# Patient Record
Sex: Female | Born: 2000 | Race: White | Hispanic: No | Marital: Single | Smoking: Never smoker
Health system: Southern US, Community
[De-identification: ages and names within clinical notes are randomized; demographics above are authoritative.]

## PROBLEM LIST (undated history)

## (undated) DIAGNOSIS — E282 Polycystic ovarian syndrome: Secondary | ICD-10-CM

## (undated) HISTORY — DX: Polycystic ovarian syndrome: E28.2

---
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
## 2022-02-20 ENCOUNTER — Emergency Department (HOSPITAL_COMMUNITY): Payer: PRIVATE HEALTH INSURANCE

## 2022-02-20 ENCOUNTER — Encounter (HOSPITAL_COMMUNITY): Payer: Self-pay | Admitting: Emergency Medicine

## 2022-02-20 ENCOUNTER — Emergency Department (HOSPITAL_COMMUNITY)
Admission: EM | Admit: 2022-02-20 | Discharge: 2022-02-20 | Disposition: A | Discharge status: Home / Self Care | Payer: PRIVATE HEALTH INSURANCE | Attending: Emergency Medicine | Admitting: Emergency Medicine

## 2022-02-20 DIAGNOSIS — M25561 Pain in right knee: Secondary | ICD-10-CM | POA: Insufficient documentation

## 2022-02-20 NOTE — ED Provider Notes (Incomplete)
Emergency Department Provider Note   I have reviewed the triage vital signs and the nursing notes.   HISTORY  Chief Complaint Knee Pain   HPI Jadah Bobak is a 21 y.o. female ***   {**SYMPTOM/COMPLAINT  LOCATION (describe anatomically) DURATION (when did it start) TIMING (onset and pattern) SEVERITY (0-10, mild/moderate/severe) QUALITY (description of symptoms) CONTEXT (recent surgery, new meds, activity, etc.) MODIFYINGFACTORS (what makes it better/worse) ASSOCIATEDSYMPTOMS (pertinent positives and negatives)**}  Past Medical History:  Diagnosis Date   PCOS (polycystic ovarian syndrome)     Review of Systems {** Revise as appropriate then delete this line - Documentation of 10 systems OR 2 systems and "10-point ROS otherwise negative" is required **}Constitutional: No fever/chills Eyes: No visual changes. ENT: No sore throat. Cardiovascular: Denies chest pain. Respiratory: Denies shortness of breath. Gastrointestinal: No abdominal pain.  No nausea, no vomiting.  No diarrhea.  No constipation. Genitourinary: Negative for dysuria. Musculoskeletal: Negative for back pain. Skin: Negative for rash. Neurological: Negative for headaches, focal weakness or numbness. {**Psychiatric:  Endocrine:  Hematological/Lymphatic:  Allergic/Immunilogical: **}  ____________________________________________   PHYSICAL EXAM:  VITAL SIGNS: ED Triage Vitals  Enc Vitals Group     BP 02/20/22 1701 127/85     Pulse Rate 02/20/22 1701 90     Resp 02/20/22 1701 16     Temp 02/20/22 1701 98.2 F (36.8 C)     Temp Source 02/20/22 1701 Oral     SpO2 02/20/22 1701 99 %     Weight --      Height --      Head Circumference --      Peak Flow --      Pain Score 02/20/22 1659 0     Pain Loc --      Pain Edu? --      Excl. in GC? --    {** Revise as appropriate then delete this line - 8 systems required **} Constitutional: Alert and oriented. Well appearing and in no acute  distress. Eyes: Conjunctivae are normal. PERRL. EOMI. Head: Atraumatic. {**Ears:  Healthy appearing ear canals and TMs bilaterally **}Nose: No congestion/rhinnorhea. Mouth/Throat: Mucous membranes are moist.  Oropharynx non-erythematous. Neck: No stridor.  No meningeal signs.  {**No cervical spine tenderness to palpation.**} Cardiovascular: Normal rate, regular rhythm. Good peripheral circulation. Grossly normal heart sounds.   Respiratory: Normal respiratory effort.  No retractions. Lungs CTAB. Gastrointestinal: Soft and nontender. No distention.  {**Genitourinary:  **}Musculoskeletal: No lower extremity tenderness nor edema. No gross deformities of extremities. Neurologic:  Normal speech and language. No gross focal neurologic deficits are appreciated.  Skin:  Skin is warm, dry and intact. No rash noted. {**Psychiatric: Mood and affect are normal. Speech and behavior are normal.**}  ____________________________________________   LABS (all labs ordered are listed, but only abnormal results are displayed)  Labs Reviewed - No data to display ____________________________________________  EKG  *** ____________________________________________  RADIOLOGY  DG Knee Complete 4 Views Right  Result Date: 02/20/2022 CLINICAL DATA:  Knee pain. EXAM: RIGHT KNEE - COMPLETE 4+ VIEW COMPARISON:  None Available. FINDINGS: No evidence of fracture, dislocation, or joint effusion. No evidence of arthropathy or other focal bone abnormality. Soft tissues are unremarkable. IMPRESSION: Negative. Electronically Signed   By: Darliss Cheney M.D.   On: 02/20/2022 17:30    ____________________________________________   PROCEDURES  Procedure(s) performed:   Procedures   ____________________________________________   INITIAL IMPRESSION / ASSESSMENT AND PLAN / ED COURSE  Pertinent labs & imaging results that were available  during my care of the patient were reviewed by me and considered in my  medical decision making (see chart for details).   This patient is Presenting for Evaluation of ***, which {Range:23949} require a range of treatment options, and {MDMcomplaint:23950} a complaint that involves a {MDMlevelrisk:23951} risk of morbidity and mortality.  The Differential Diagnoses include***.  Critical Interventions-    Medications - No data to display  Reassessment after intervention:     I *** Additional Historical Information from ***, as the patient is ***.  I decided to review pertinent External Data, and in summary ***.   Clinical Laboratory Tests Ordered, included   Radiologic Tests Ordered, included ***. I independently interpreted the images and agree with radiology interpretation.   Cardiac Monitor Tracing which shows ***   Social Determinants of Health Risk ***  Consult complete with  Medical Decision Making: Summary: ***  Reevaluation with update and discussion with   ***Considered admission***  Disposition:   ____________________________________________  FINAL CLINICAL IMPRESSION(S) / ED DIAGNOSES  Final diagnoses:  None     NEW OUTPATIENT MEDICATIONS STARTED DURING THIS VISIT:  New Prescriptions   No medications on file    Note:  This document was prepared using Dragon voice recognition software and may include unintentional dictation errors.  Alona Bene, MD, [facility]

## 2022-02-20 NOTE — Discharge Instructions (Signed)

## 2022-02-20 NOTE — ED Triage Notes (Signed)
Patient complains of right knee pain that started at approximately 2000 last night. Patient was at work, knelt down and when she stood back up she felt a "pop" in her right knee and the pain started. Patient states she has been unable to bear weight since the event.

## 2022-02-20 NOTE — ED Notes (Signed)
Educated pt on crutch use and ace bandage. Pt demonstrated ability to use crutch w/ no issue.

## 2022-02-20 NOTE — ED Provider Triage Note (Signed)
Emergency Medicine Provider Triage Evaluation Note  Alicia Hood , a 21 y.o. female  was evaluated in triage.  Pt complains of acute onset of right knee pain and lower leg pain starting last night.  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  She has been unable to bear weight since that time.  She has pain in the back of the knee and the right lateral calf.  She has wrapped the injury prior to arrival.  Review of Systems  Positive: Leg pain, knee pain Negative: Numbness, tingling  Physical Exam  There were no vitals taken for this visit. Gen:   Awake, no distress   Resp:  Normal effort  MSK:   Moves extremities without difficulty  Other:  Tenderness to palpation in the popliteal fossa in the right posterior lateral calf, no tenderness over the patella, no obvious patellar tendon or quadriceps tendon injury  Medical Decision Making  Medically screening exam initiated at 5:00 PM.  Appropriate orders placed.  Lirio Bach was informed that the remainder of the evaluation will be completed by another provider, this initial triage assessment does not replace that evaluation, and the importance of remaining in the ED until their evaluation is complete.     Renne Crigler, PA-C 02/20/22 1701

## 2022-04-08 ENCOUNTER — Other Ambulatory Visit: Payer: Self-pay | Admitting: Family

## 2022-06-29 ENCOUNTER — Encounter: Payer: Self-pay | Admitting: Family

## 2022-07-08 ENCOUNTER — Encounter: Payer: Self-pay | Admitting: Family

## 2022-07-08 ENCOUNTER — Ambulatory Visit (INDEPENDENT_AMBULATORY_CARE_PROVIDER_SITE_OTHER): Payer: 59 | Admitting: Family

## 2022-07-08 VITALS — BP 132/89 | HR 93 | Ht 64.27 in | Wt 255.0 lb

## 2022-07-08 DIAGNOSIS — G4726 Circadian rhythm sleep disorder, shift work type: Secondary | ICD-10-CM | POA: Diagnosis not present

## 2022-07-08 DIAGNOSIS — Z3202 Encounter for pregnancy test, result negative: Secondary | ICD-10-CM

## 2022-07-08 DIAGNOSIS — E282 Polycystic ovarian syndrome: Secondary | ICD-10-CM | POA: Diagnosis not present

## 2022-07-08 DIAGNOSIS — N898 Other specified noninflammatory disorders of vagina: Secondary | ICD-10-CM

## 2022-07-08 DIAGNOSIS — F4323 Adjustment disorder with mixed anxiety and depressed mood: Secondary | ICD-10-CM

## 2022-07-08 DIAGNOSIS — Z113 Encounter for screening for infections with a predominantly sexual mode of transmission: Secondary | ICD-10-CM

## 2022-07-08 LAB — POCT URINE PREGNANCY: Preg Test, Ur: NEGATIVE

## 2022-07-08 MED ORDER — NAPROXEN 250 MG PO TABS: 500.0000 mg | ORAL_TABLET | Freq: Two times a day (BID) | ORAL | 0 refills | Status: DC

## 2022-07-08 MED ORDER — NORETHINDRONE ACET-ETHINYL EST 1.5-30 MG-MCG PO TABS: 1.0000 | ORAL_TABLET | Freq: Every day | ORAL | 3 refills | Status: DC

## 2022-07-08 NOTE — Progress Notes (Unsigned)
THIS RECORD MAY CONTAIN CONFIDENTIAL INFORMATION THAT SHOULD NOT BE RELEASED WITHOUT REVIEW OF THE SERVICE PROVIDER.  Adolescent Medicine Consultation Follow-Up Visit Alicia Hood  is a 21 y.o. 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 provider found here today for follow-up regarding birth control refill.  Patient states she feels better without birth control mentally but has worse cramps. Was started on OCP d/t PCOS with bad cramps but is unsure about the flow. Patient denied any recent sexually activity, fever, abnormal discharge, rash or illness. She denied any abdominal pain or pain with intercourse.  Last time took OCP: august 1st day of LMP: 06/29/22 - 07/01/10/1 How long: 3-4 days Flow: not very heavy but up and down (avg ~ 3 regular tampons per day) SXS: really bad abdominal cramps at the start of the period (debilitating cramps prevents her from working) - was taking about 600 mg Q5H Mood: much better off the OCPs - doesn't feel as lonely or need to be distracted Sexually active: yes with men (uses condoms 100% of the time)     Supervising physician: Dr. Theadore Nan   Plan at last adolescent specialty clinic visit included 08/15/21 -continue sertraline 50 mg QD  -rule of 3's (3 meals, 3 snacks and no more than 3 hours awake btw meals)  Pertinent Labs? Yes - negative gc/c 08/29/21 Growth Chart Viewed? not applicable   History was provided by the patient.   HPI:   PCP Confirmed?  Does not have one yet (needs to set up with family [facility]) is working on her insurance   Patient's personal or confidential phone number: [phone]   No LMP recorded. No Known Allergies Current Outpatient Medications on File Prior to Visit  Medication Sig Dispense Refill   ibuprofen (ADVIL) 600 MG tablet Take 1 tablet (600 mg total) by mouth every 6 (six) hours as needed. 30 tablet 0   BLISOVI FE 1/20  1-20 MG-MCG tablet TAKE 1 TABLET BY MOUTH DAILY (Patient not taking: Reported on 07/08/2022) 84 tablet 3   dicyclomine (BENTYL) 20 MG tablet Take 1 tablet (20 mg total) by mouth 2 (two) times daily. (Patient not taking: Reported on 08/15/2021) 20 tablet 0   famotidine (PEPCID) 40 MG tablet Take 1 tablet (40 mg total) by mouth daily. (Patient not taking: Reported on 08/15/2021) 14 tablet 0   hydrOXYzine (ATARAX/VISTARIL) 10 MG tablet TAKE 1 TABLET(10 MG) BY MOUTH THREE TIMES DAILY AS NEEDED (Patient not taking: Reported on 07/08/2022) 90 tablet 0   ondansetron (ZOFRAN-ODT) 4 MG disintegrating tablet Take 1 tablet (4 mg total) by mouth every 8 (eight) hours as needed for nausea or vomiting. (Patient not taking: Reported on 08/15/2021) 20 tablet 0   sertraline (ZOLOFT) 50 MG tablet TAKE 1 TABLET(50 MG) BY MOUTH DAILY (Patient not taking: Reported on 07/08/2022) 90 tablet 0   No current facility-administered medications on file prior to visit.    Patient Active Problem List   Diagnosis Date Noted   GAD (generalized anxiety disorder) 04/01/2021   PCOS (polycystic ovarian syndrome) 08/05/2017    Lifestyle habits that can impact QOL: Sleep:not better than last time (still sleeps about 7-8 hours in a 24 hour period if working) sometimes 10-12 if not working but it is disjointed Eating habits/patterns: a lot of frozen meals - whatever is quick and easy Chips/candy/fast food - sometimes bc it is easiest Portion size: normal - doesn't feel like she overeats  Water intake: more at work than not (~30 oz a day) Body Movement: walking around at work - walks around the unit alot  Confidentiality was discussed with the patient and if applicable, with caregiver as well.  Changes at home or school since last visit:  lives with mom  Tobacco?  no Drugs/ETOH?  no Partner preference?  female  Sexually Active?  yes; condoms and birth control pills  Suicidal or homicidal thoughts?   no Self injurious behaviors?   no   Physical Exam:  Vitals:   07/08/22 0846  BP: 132/89  Pulse: 93  Weight: 255 lb (115.7 kg)  Height: 5' 4.27" (1.632 m)   BP 132/89   Pulse 93   Ht 5' 4.27" (1.632 m)   Wt 255 lb (115.7 kg)   BMI 43.40 kg/m  Body mass index: body mass index is 43.4 kg/m. Blood pressure %iles are not available for patients who are 18 years or older.  Physical Exam HENT:     Right Ear: Tympanic membrane normal.     Left Ear: Tympanic membrane normal.     Nose: Nose normal. No congestion.     Mouth/Throat:     Mouth: Mucous membranes are moist.     Pharynx: Oropharynx is clear. No oropharyngeal exudate or posterior oropharyngeal erythema.  Eyes:     Extraocular Movements: Extraocular movements intact.     Conjunctiva/sclera: Conjunctivae normal.     Pupils: Pupils are equal, round, and reactive to light.  Cardiovascular:     Rate and Rhythm: Normal rate and regular rhythm.     Heart sounds: No murmur heard. Pulmonary:     Effort: Pulmonary effort is normal.     Breath sounds: Normal breath sounds. No wheezing.  Abdominal:     General: Abdomen is flat. Bowel sounds are normal. There is no distension.     Palpations: Abdomen is soft.  Musculoskeletal:        General: Normal range of motion.     Cervical back: Normal range of motion. No rigidity.  Lymphadenopathy:     Cervical: No cervical adenopathy.  Skin:    General: Skin is warm.     Capillary Refill: Capillary refill takes less than 2 seconds.  Neurological:     Mental Status: She is alert.  Psychiatric:        Mood and Affect: Mood normal.        Behavior: Behavior normal.        Thought Content: Thought content normal.     Assessment/Plan: Patient with PMHx of PCOS presenting with c/o improved mood following self discontinuation of OCPs and sertraline with same amount of cramping and no changes to bleeding. Suspect changes to mood could be related to cessation of sertraline, sleep disorder 2/2 shift work or cessation of  birth control. However, c/f increased bleeding if pt is to continue off OCPs given h/o PCOS with heavy bleeding in the past. Plan to continue on OCP with no changes between to generics to account for inactive ingredient contribution to sxs and follow up in 8 weeks. Will prescribed naproxen for patient to use in place of ibuprofen for cramping.  1. PCOS (polycystic ovarian syndrome) - naproxen (NAPROSYN) 250 MG tablet; Take 2 tablets (500 mg total) by mouth 2 (two) times daily with a meal. As needed for cramping   - Norethindrone Acetate-Ethinyl Estradiol (LOESTRIN) 1.5-30 MG-MCG tablet; Take 1 tablet by mouth daily.    2. Adjustment disorder with mixed anxiety and depressed  mood 3. Shift work sleep disorder -counseled on vitamin D (sun or food) exposure for mood -counseled on exercise and healthy dietary choices for PCOS sxs -will follow up in 8 weeks   [place] screenings:     07/08/2022    9:56 AM 05/16/2021    8:51 AM 04/01/2021   11:24 AM  PHQ-SADS Last 3 Score only  PHQ-15 Score 5 8 2   Total GAD-7 Score 1 4 4   PHQ Adolescent Score 4 2 2   Screens performed during this visit were discussed with patient and parent and adjustments to plan made accordingly.   Follow-up:  8 weeks or sooner if needed   I spent >45 minutes spent face to face with patient with more than 50% of appointment spent discussing diagnosis, management, follow-up, and reviewing of previous chart, birth control options, diet and lifestyle counseling. I spent an additional 30 minutes on pre-and post-visit activities.  Sherie Don, MD 07/08/22   Supervising Provider Co-Signature.  I participated in the care of this patient and reviewed the findings documented by the resident. I developed the management plan that is described in the resident's note and personally reviewed the plan with the patient.   Parthenia Ames, NP Adolescent Medicine Specialist

## 2022-07-09 ENCOUNTER — Other Ambulatory Visit: Payer: Self-pay | Admitting: Family

## 2022-07-09 ENCOUNTER — Encounter: Payer: Self-pay | Admitting: Family

## 2022-07-09 LAB — WET PREP BY MOLECULAR PROBE
Candida species: NOT DETECTED
MICRO NUMBER:: 14156337
SPECIMEN QUALITY:: ADEQUATE
Trichomonas vaginosis: NOT DETECTED

## 2022-07-09 LAB — C. TRACHOMATIS/N. GONORRHOEAE RNA
C. trachomatis RNA, TMA: NOT DETECTED
N. gonorrhoeae RNA, TMA: NOT DETECTED

## 2022-07-09 MED ORDER — METRONIDAZOLE 500 MG PO TABS: 500.0000 mg | ORAL_TABLET | Freq: Two times a day (BID) | ORAL | 0 refills | Status: AC

## 2022-07-10 ENCOUNTER — Ambulatory Visit: Payer: 59 | Admitting: Family

## 2022-07-14 ENCOUNTER — Other Ambulatory Visit: Payer: Self-pay | Admitting: Pediatrics

## 2022-07-14 DIAGNOSIS — E282 Polycystic ovarian syndrome: Secondary | ICD-10-CM

## 2022-08-18 ENCOUNTER — Ambulatory Visit
Admission: RE | Admit: 2022-08-18 | Discharge: 2022-08-18 | Disposition: A | Discharge status: Home / Self Care | Payer: 59 | Source: Ambulatory Visit | Attending: Urgent Care | Admitting: Urgent Care

## 2022-08-18 VITALS — BP 124/93 | HR 103 | Temp 99.3°F | Resp 20

## 2022-08-18 DIAGNOSIS — J018 Other acute sinusitis: Secondary | ICD-10-CM | POA: Diagnosis not present

## 2022-08-18 LAB — POCT RAPID STREP A (OFFICE): Rapid Strep A Screen: NEGATIVE

## 2022-08-18 MED ORDER — PROMETHAZINE-DM 6.25-15 MG/5ML PO SYRP: 5.0000 mL | ORAL_SOLUTION | Freq: Three times a day (TID) | ORAL | 0 refills | Status: DC | PRN

## 2022-08-18 MED ORDER — PSEUDOEPHEDRINE HCL 60 MG PO TABS
60.0000 mg | ORAL_TABLET | Freq: Three times a day (TID) | ORAL | 0 refills | Status: DC | PRN
Start: 2022-08-18 — End: 2022-09-05

## 2022-08-18 MED ORDER — AMOXICILLIN 875 MG PO TABS: 875.0000 mg | ORAL_TABLET | Freq: Two times a day (BID) | ORAL | 0 refills | Status: DC

## 2022-08-18 NOTE — ED Triage Notes (Signed)
Pt c/o sore throat, nasal congestion, body aches, fever-sx started 6 days ago-NAD-steady gait

## 2022-08-18 NOTE — ED Provider Notes (Signed)
[facility]  Note:  This document was prepared using Conservation officer, historic buildings and may include unintentional dictation errors.  MRN: [id number] DOB: [birth year]  Subjective:   Alicia Hood is a 21 y.o. female presenting for 6-day history of acute onset throat pain, sinus congestion, postnasal drainage, body aches, fever.  No overt chest pain, shortness of breath or wheezing.  No history of asthma.  No current facility-administered medications for this encounter.  Current Outpatient Medications:    BLISOVI FE 1/20 1-20 MG-MCG tablet, TAKE 1 TABLET BY MOUTH DAILY (Patient not taking: Reported on 07/08/2022), Disp: 84 tablet, Rfl: 3   dicyclomine (BENTYL) 20 MG tablet, Take 1 tablet (20 mg total) by mouth 2 (two) times daily. (Patient not taking: Reported on 08/15/2021), Disp: 20 tablet, Rfl: 0   famotidine (PEPCID) 40 MG tablet, Take 1 tablet (40 mg total) by mouth daily. (Patient not taking: Reported on 08/15/2021), Disp: 14 tablet, Rfl: 0   hydrOXYzine (ATARAX/VISTARIL) 10 MG tablet, TAKE 1 TABLET(10 MG) BY MOUTH THREE TIMES DAILY AS NEEDED (Patient not taking: Reported on 07/08/2022), Disp: 90 tablet, Rfl: 0   ibuprofen (ADVIL) 600 MG tablet, Take 1 tablet (600 mg total) by mouth every 6 (six) hours as needed., Disp: 30 tablet, Rfl: 0   naproxen (NAPROSYN) 250 MG tablet, Take 2 tablets (500 mg total) by mouth 2 (two) times daily with a meal. As needed for cramping, Disp: 30 tablet, Rfl: 0   Norethindrone Acetate-Ethinyl Estradiol (LOESTRIN) 1.5-30 MG-MCG tablet, Take 1 tablet by mouth daily., Disp: 84 tablet, Rfl: 3   ondansetron (ZOFRAN-ODT) 4 MG disintegrating tablet, Take 1 tablet (4 mg total) by mouth every 8 (eight) hours as needed for nausea or vomiting. (Patient not taking: Reported on 08/15/2021), Disp: 20 tablet, Rfl: 0   sertraline (ZOLOFT) 50 MG tablet, TAKE 1 TABLET(50 MG) BY MOUTH DAILY (Patient not taking: Reported on 07/08/2022), Disp: 90 tablet,  Rfl: 0   No Known Allergies  Past Medical History:  Diagnosis Date   PCOS (polycystic ovarian syndrome)      Past Surgical History:  Procedure Laterality Date   ADENOIDECTOMY  2010   TYMPANOSTOMY TUBE PLACEMENT  2010    Family History  Problem Relation Age of Onset   Gestational diabetes Mother     Social History   Tobacco Use   Smoking status: Never    Passive exposure: Yes   Smokeless tobacco: Never  Vaping Use   Vaping Use: Never used  Substance Use Topics   Alcohol use: Never   Drug use: Never    ROS   Objective:   Vitals: BP (!) 124/93 (BP Location: Left Arm)   Pulse (!) 103   Temp 99.3 F (37.4 C) (Oral)   Resp 20   LMP 07/31/2022   SpO2 98%   Physical Exam Constitutional:      General: She is not in acute distress.    Appearance: Normal appearance. She is well-developed and normal weight. She is not ill-appearing, toxic-appearing or diaphoretic.  HENT:     Head: Normocephalic and atraumatic.     Right Ear: Tympanic membrane, ear canal and external ear normal. No drainage or tenderness. No middle ear effusion. There is no impacted cerumen. Tympanic membrane is not erythematous or bulging.     Left Ear: Tympanic membrane, ear canal and external ear normal. No drainage or tenderness.  No middle ear effusion. There is no impacted cerumen. Tympanic membrane is not erythematous or bulging.  Nose: Congestion present. No rhinorrhea.     Mouth/Throat:     Mouth: Mucous membranes are moist. No oral lesions.     Pharynx: No pharyngeal swelling, oropharyngeal exudate, posterior oropharyngeal erythema or uvula swelling.     Tonsils: No tonsillar exudate or tonsillar abscesses.  Eyes:     General: No scleral icterus.       Right eye: No discharge.        Left eye: No discharge.     Extraocular Movements: Extraocular movements intact.     Right eye: Normal extraocular motion.     Left eye: Normal extraocular motion.     Conjunctiva/sclera: Conjunctivae  normal.  Cardiovascular:     Rate and Rhythm: Normal rate and regular rhythm.     Heart sounds: Normal heart sounds. No murmur heard.    No friction rub. No gallop.  Pulmonary:     Effort: Pulmonary effort is normal. No respiratory distress.     Breath sounds: No stridor. No wheezing, rhonchi or rales.  Chest:     Chest wall: No tenderness.  Musculoskeletal:     Cervical back: Normal range of motion and neck supple.  Lymphadenopathy:     Cervical: No cervical adenopathy.  Skin:    General: Skin is warm and dry.  Neurological:     General: No focal deficit present.     Mental Status: She is alert and oriented to person, place, and time.  Psychiatric:        Mood and Affect: Mood normal.        Behavior: Behavior normal.     Results for orders placed or performed during the hospital encounter of 08/18/22 (from the past 24 hour(s))  POCT rapid strep A     Status: None   Collection Time: 08/18/22  6:33 PM  Result Value Ref Range   Rapid Strep A Screen Negative Negative    Assessment and Plan :   PDMP not reviewed this encounter.  1. Other acute sinusitis, recurrence not specified     Deferred imaging given clear cardiopulmonary exam, hemodynamically stable vital signs. Will start empiric treatment for sinusitis with amoxicillin.  Recommended supportive care otherwise including the use of oral antihistamine, decongestant.  No need for throat culture given above treatment plan.  Counseled patient on potential for adverse effects with medications prescribed/recommended today, ER and return-to-clinic precautions discussed, patient verbalized understanding.    Wallis Bamberg, [place] 08/19/22 [phone]

## 2022-09-05 ENCOUNTER — Other Ambulatory Visit: Payer: Self-pay | Admitting: Family

## 2022-09-05 ENCOUNTER — Encounter: Payer: Self-pay | Admitting: Family

## 2022-09-05 ENCOUNTER — Other Ambulatory Visit (HOSPITAL_COMMUNITY)
Admission: RE | Admit: 2022-09-05 | Discharge: 2022-09-05 | Disposition: A | Discharge status: Home / Self Care | Payer: 59 | Source: Ambulatory Visit | Attending: Family | Admitting: Family

## 2022-09-05 ENCOUNTER — Ambulatory Visit (INDEPENDENT_AMBULATORY_CARE_PROVIDER_SITE_OTHER): Payer: 59 | Admitting: Family

## 2022-09-05 VITALS — BP 120/90 | HR 94 | Wt 252.0 lb

## 2022-09-05 DIAGNOSIS — Z113 Encounter for screening for infections with a predominantly sexual mode of transmission: Secondary | ICD-10-CM | POA: Diagnosis not present

## 2022-09-05 DIAGNOSIS — E559 Vitamin D deficiency, unspecified: Secondary | ICD-10-CM | POA: Diagnosis not present

## 2022-09-05 DIAGNOSIS — E282 Polycystic ovarian syndrome: Secondary | ICD-10-CM

## 2022-09-05 DIAGNOSIS — Z3202 Encounter for pregnancy test, result negative: Secondary | ICD-10-CM

## 2022-09-05 NOTE — Progress Notes (Signed)
History was provided by the {relatives:19415}.  Alicia Hood is a 22 y.o. female who is here for ***.   PCP confirmed? {yes no:314532}  Pcp, No  HPI:   No breakthrough bleeding  No pain with intercourse  Some back pain/cramping before periods  Not bleeding heavy, only for about 2 days of cycle  Skin is clear, no acne concerns    Patient Active Problem List   Diagnosis Date Noted   GAD (generalized anxiety disorder) 04/01/2021   PCOS (polycystic ovarian syndrome) 08/05/2017    Current Outpatient Medications on File Prior to Visit  Medication Sig Dispense Refill   amoxicillin (AMOXIL) 875 MG tablet Take 1 tablet (875 mg total) by mouth 2 (two) times daily. 14 tablet 0   BLISOVI FE 1/20 1-20 MG-MCG tablet TAKE 1 TABLET BY MOUTH DAILY (Patient not taking: Reported on 07/08/2022) 84 tablet 3   dicyclomine (BENTYL) 20 MG tablet Take 1 tablet (20 mg total) by mouth 2 (two) times daily. (Patient not taking: Reported on 08/15/2021) 20 tablet 0   famotidine (PEPCID) 40 MG tablet Take 1 tablet (40 mg total) by mouth daily. (Patient not taking: Reported on 08/15/2021) 14 tablet 0   hydrOXYzine (ATARAX/VISTARIL) 10 MG tablet TAKE 1 TABLET(10 MG) BY MOUTH THREE TIMES DAILY AS NEEDED (Patient not taking: Reported on 07/08/2022) 90 tablet 0   ibuprofen (ADVIL) 600 MG tablet Take 1 tablet (600 mg total) by mouth every 6 (six) hours as needed. 30 tablet 0   naproxen (NAPROSYN) 250 MG tablet Take 2 tablets (500 mg total) by mouth 2 (two) times daily with a meal. As needed for cramping 30 tablet 0   Norethindrone Acetate-Ethinyl Estradiol (LOESTRIN) 1.5-30 MG-MCG tablet Take 1 tablet by mouth daily. 84 tablet 3   ondansetron (ZOFRAN-ODT) 4 MG disintegrating tablet Take 1 tablet (4 mg total) by mouth every 8 (eight) hours as needed for nausea or vomiting. (Patient not taking: Reported on 08/15/2021) 20 tablet 0   promethazine-dextromethorphan (PROMETHAZINE-DM) 6.25-15 MG/5ML syrup Take 5 mLs by mouth 3  (three) times daily as needed for cough. 200 mL 0   pseudoephedrine (SUDAFED) 60 MG tablet Take 1 tablet (60 mg total) by mouth every 8 (eight) hours as needed for congestion. 30 tablet 0   sertraline (ZOLOFT) 50 MG tablet TAKE 1 TABLET(50 MG) BY MOUTH DAILY (Patient not taking: Reported on 07/08/2022) 90 tablet 0   No current facility-administered medications on file prior to visit.    No Known Allergies  Physical Exam:    Vitals:   09/05/22 0853  BP: (!) 120/90  Pulse: 94  Weight: 252 lb (114.3 kg)   Wt Readings from Last 3 Encounters:  09/05/22 252 lb (114.3 kg) (>99 %, Z= 2.52)*  07/08/22 255 lb (115.7 kg) (>99 %, Z= 2.54)*  08/15/21 255 lb (115.7 kg) (>99 %, Z= 2.49)*   * Growth percentiles are based on CDC (Girls, 2-20 Years) data.     Blood pressure %iles are not available for patients who are 18 years or older. Patient's last menstrual period was 07/31/2022.  Physical Exam   Assessment/Plan: ***

## 2022-09-06 ENCOUNTER — Other Ambulatory Visit: Payer: Self-pay | Admitting: Family

## 2022-09-06 LAB — COMPREHENSIVE METABOLIC PANEL
AG Ratio: 1.4 (calc) (ref 1.0–2.5)
ALT: 25 U/L (ref 5–32)
AST: 15 U/L (ref 12–32)
Albumin: 4.2 g/dL (ref 3.6–5.1)
Alkaline phosphatase (APISO): 62 U/L (ref 36–128)
BUN: 10 mg/dL (ref 7–20)
CO2: 28 mmol/L (ref 20–32)
Calcium: 9.4 mg/dL (ref 8.9–10.4)
Chloride: 106 mmol/L (ref 98–110)
Creat: 0.73 mg/dL (ref 0.50–0.96)
Globulin: 2.9 g/dL (calc) (ref 2.0–3.8)
Glucose, Bld: 96 mg/dL (ref 65–99)
Potassium: 4.5 mmol/L (ref 3.8–5.1)
Sodium: 139 mmol/L (ref 135–146)
Total Bilirubin: 0.5 mg/dL (ref 0.2–1.1)
Total Protein: 7.1 g/dL (ref 6.3–8.2)

## 2022-09-06 LAB — LIPID PANEL
Cholesterol: 194 mg/dL — ABNORMAL HIGH (ref <170)
HDL: 49 mg/dL (ref >45)
LDL Cholesterol (Calc): 129 mg/dL (calc) — ABNORMAL HIGH (ref <110)
Non-HDL Cholesterol (Calc): 145 mg/dL (calc) — ABNORMAL HIGH (ref <120)
Total CHOL/HDL Ratio: 4 (calc) (ref <5.0)
Triglycerides: 70 mg/dL (ref <90)

## 2022-09-06 LAB — CBC WITH DIFFERENTIAL/PLATELET
Absolute Monocytes: 486 cells/uL (ref 200–950)
Basophils Absolute: 49 cells/uL (ref 0–200)
Basophils Relative: 0.6 %
Eosinophils Absolute: 300 cells/uL (ref 15–500)
Eosinophils Relative: 3.7 %
HCT: 41 % (ref 35.0–45.0)
Hemoglobin: 13.6 g/dL (ref 11.7–15.5)
Lymphs Abs: 2163 cells/uL (ref 850–3900)
MCH: 28.8 pg (ref 27.0–33.0)
MCHC: 33.2 g/dL (ref 32.0–36.0)
MCV: 86.9 fL (ref 80.0–100.0)
MPV: 13 fL — ABNORMAL HIGH (ref 7.5–12.5)
Monocytes Relative: 6 %
Neutro Abs: 5103 cells/uL (ref 1500–7800)
Neutrophils Relative %: 63 %
Platelets: 255 10*3/uL (ref 140–400)
RBC: 4.72 10*6/uL (ref 3.80–5.10)
RDW: 12.5 % (ref 11.0–15.0)
Total Lymphocyte: 26.7 %
WBC: 8.1 10*3/uL (ref 3.8–10.8)

## 2022-09-06 LAB — HEMOGLOBIN A1C
Hgb A1c MFr Bld: 5.5 % of total Hgb (ref <5.7)
Mean Plasma Glucose: 111 mg/dL
eAG (mmol/L): 6.2 mmol/L

## 2022-09-06 LAB — VITAMIN D 25 HYDROXY (VIT D DEFICIENCY, FRACTURES): Vit D, 25-Hydroxy: 14 ng/mL — ABNORMAL LOW (ref 30–100)

## 2022-09-06 MED ORDER — VITAMIN D (ERGOCALCIFEROL) 1.25 MG (50000 UNIT) PO CAPS: 50000.0000 [IU] | ORAL_CAPSULE | ORAL | 0 refills | Status: DC

## 2022-09-08 LAB — URINE CYTOLOGY ANCILLARY ONLY
Chlamydia: NEGATIVE
Comment: NEGATIVE
Comment: NORMAL
Neisseria Gonorrhea: NEGATIVE

## 2022-09-09 ENCOUNTER — Encounter: Payer: Self-pay | Admitting: Family

## 2022-09-09 LAB — POCT URINE PREGNANCY: Preg Test, Ur: NEGATIVE

## 2022-10-31 ENCOUNTER — Other Ambulatory Visit: Payer: Self-pay | Admitting: Family

## 2022-11-06 DIAGNOSIS — E282 Polycystic ovarian syndrome: Secondary | ICD-10-CM | POA: Diagnosis not present

## 2022-11-06 DIAGNOSIS — E559 Vitamin D deficiency, unspecified: Secondary | ICD-10-CM | POA: Diagnosis not present

## 2022-12-05 ENCOUNTER — Encounter: Payer: Self-pay | Admitting: Family

## 2022-12-05 ENCOUNTER — Ambulatory Visit (INDEPENDENT_AMBULATORY_CARE_PROVIDER_SITE_OTHER): Payer: 59 | Admitting: Family

## 2022-12-05 VITALS — BP 141/95 | HR 90 | Ht 64.57 in | Wt 256.4 lb

## 2022-12-05 DIAGNOSIS — E559 Vitamin D deficiency, unspecified: Secondary | ICD-10-CM

## 2022-12-05 DIAGNOSIS — F4323 Adjustment disorder with mixed anxiety and depressed mood: Secondary | ICD-10-CM

## 2022-12-05 DIAGNOSIS — G4726 Circadian rhythm sleep disorder, shift work type: Secondary | ICD-10-CM | POA: Diagnosis not present

## 2022-12-05 DIAGNOSIS — E282 Polycystic ovarian syndrome: Secondary | ICD-10-CM | POA: Diagnosis not present

## 2022-12-05 DIAGNOSIS — Z3202 Encounter for pregnancy test, result negative: Secondary | ICD-10-CM | POA: Diagnosis not present

## 2022-12-05 LAB — POCT URINE PREGNANCY: Preg Test, Ur: NEGATIVE

## 2022-12-05 NOTE — Progress Notes (Unsigned)
History was provided by the patient.  Alicia Hood is a 22 y.o. female who is here for PCOS, increased anxiety.  Chief Complaint: increased anxiety    PCP confirmed? Yes, established with Dorinda Hill, MD   Plan from last visit:  1. PCOS (polycystic ovarian syndrome) -comorbidity labs today  -continue with OCP  -return in 3 months or sooner if needed -return precautions reviewed  - CBC with Differential/Platelet - Comprehensive metabolic panel - Hemoglobin A1c - Lipid panel   2. Vitamin D deficiency - VITAMIN D 25 Hydroxy (Vit-D Deficiency, Fractures)   3. Screening examination for venereal disease - Urine cytology ancillary only   4. Negative pregnancy test - POCT urine pregnancy  HPI:    -had first appointment with Dorinda Hill  -periods are still a little off, last one in Feb  -has been on and off spotting since then, only with wiping  -no full period  -no missed pills  -sexually active but not concerned for pregnancy  -no acne  -mood overall: struggling; anxiety has been up  -was not taking hydroxyzine when she established with PCP, but started retaking hydroxyzine 10-20 mg for anxiety  -would sleep for 2 hours waking up in panic, happened for the last 1-2 months  -also times when she just sleeps a lot  -tonight was first night shift back, but slept continuously for the 48 hours prior  -appetite: not the greatest, typically only eats once per day  -exercise is work walking/movement only  -typically takes hydroxyzine to help her fall asleep  -feels like a lot of it is situational/environmental - both work and home  -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 84 tablet 3   Vitamin D, Ergocalciferol, (DRISDOL) 1.25 MG (50000 UNIT) CAPS capsule TAKE 1 CAPSULE BY MOUTH EVERY 7 DAYS 8 capsule 0   hydrOXYzine (ATARAX/VISTARIL) 10 MG tablet TAKE 1 TABLET(10 MG) BY MOUTH THREE TIMES DAILY AS NEEDED (Patient not taking: Reported on 07/08/2022) 90 tablet 0   No current facility-administered medications on file prior to visit.    No Known Allergies  Physical Exam:    Vitals:   12/05/22 0844  BP: (!) 141/83  Pulse: 88  Weight: 256 lb 6.4 oz (116.3 kg)  Height: 5' 4.57" (1.64 m)   Wt Readings from Last 3 Encounters:  12/05/22 256 lb 6.4 oz (116.3 kg) (>99 %, Z= 2.57)*  09/05/22 252 lb (114.3 kg) (>99 %, Z= 2.52)*  07/08/22 255 lb (115.7 kg) (>99 %, Z= 2.54)*   * Growth percentiles are based on CDC (Girls, 2-20 Years) data.     Blood pressure %iles are not available for patients who are 18 years or older. No LMP recorded.  Physical Exam   Assessment/Plan: ***

## 2022-12-06 ENCOUNTER — Other Ambulatory Visit: Payer: Self-pay | Admitting: Family

## 2022-12-06 ENCOUNTER — Encounter: Payer: Self-pay | Admitting: Family

## 2022-12-06 MED ORDER — HYDROXYZINE HCL 25 MG PO TABS: 25.0000 mg | ORAL_TABLET | Freq: Every evening | ORAL | 0 refills | Status: AC | PRN

## 2022-12-17 ENCOUNTER — Telehealth (INDEPENDENT_AMBULATORY_CARE_PROVIDER_SITE_OTHER): Payer: 59 | Admitting: Family

## 2022-12-17 ENCOUNTER — Encounter: Payer: Self-pay | Admitting: Family

## 2022-12-17 DIAGNOSIS — G4726 Circadian rhythm sleep disorder, shift work type: Secondary | ICD-10-CM | POA: Diagnosis not present

## 2022-12-17 NOTE — Progress Notes (Signed)
THIS RECORD MAY CONTAIN CONFIDENTIAL INFORMATION THAT SHOULD NOT BE RELEASED WITHOUT REVIEW OF THE SERVICE PROVIDER.  Virtual Follow-Up Visit via Video Note  I connected with Alicia Hood  on 12/17/22 at  8:00 AM EDT by a video enabled telemedicine application and verified that I am speaking with the correct person using two identifiers.   Patient/parent location: home Provider location: remote [place]   I discussed the limitations of evaluation and management by telemedicine and the availability of in person appointments.  I discussed that the purpose of this telehealth visit is to provide medical care while limiting exposure to the novel coronavirus.  The patient expressed understanding and agreed to proceed.   Alicia Hood is a 22 y.o. female referred by Melida Quitter, MD here today for follow-up of shift work sleep disorder.   History was provided by the patient.  Supervising Physician: Dr. Theadore Nan   Plan from Last Visit:   1. Shift work sleep disorder -hydroxzyine 25-50 mg for sleep  -video visit in 2 weeks to reassess  -if too sedating or groggy from hydroxyzine, consider melatonin 1-3 mg  -stressed importance of vitamin D supplementation    2. PCOS (polycystic ovarian syndrome) -birth control to regulate periods; ensure bleeding at least once every 3 months; pg test negative today; last A1c was 5.5 up from 5.2 a year ago; next monitoring labs in 3 months; negative gc/c in January    3. Adjustment disorder with mixed anxiety and depressed mood -improve sleep quality; continue to monitor symptoms; declined referral for therapy  -vitamin D supplementation   4. Vitamin D deficiency -last reading was 14 in January; repeat high dose    5. Negative pregnancy test -negative  - POCT urine pregnancy  Chief Complaint: -sleep improving  History of Present Illness:  -forgot about this appointment this morning, sleeping  -hydroxyzine is helping; uses 50 mg when she  does not work next day because she really sleeps well with it; uses 25 mg on days when she works that night.  -no other concerns or questions; no headaches with med use  No Known Allergies Outpatient Medications Prior to Visit  Medication Sig Dispense Refill   hydrOXYzine (ATARAX) 25 MG tablet Take 1-2 tablets (25-50 mg total) by mouth at bedtime as needed. 90 tablet 0   Norethindrone Acetate-Ethinyl Estradiol (LOESTRIN) 1.5-30 MG-MCG tablet Take 1 tablet by mouth daily. 84 tablet 3   Vitamin D, Ergocalciferol, (DRISDOL) 1.25 MG (50000 UNIT) CAPS capsule TAKE 1 CAPSULE BY MOUTH EVERY 7 DAYS 8 capsule 0   No facility-administered medications prior to visit.     Patient Active Problem List   Diagnosis Date Noted   GAD (generalized anxiety disorder) 04/01/2021   PCOS (polycystic ovarian syndrome) 08/05/2017    The following portions of the patient's history were reviewed and updated as appropriate: allergies, current medications, past family history, past medical history, past social history, past surgical history, and problem list.  Visual Observations/Objective:   General Appearance: Well nourished well developed, in no apparent distress.  Eyes: conjunctiva no swelling or erythema ENT/Mouth: No hoarseness, No cough for duration of visit.  Neck: Supple  Respiratory: Respiratory effort normal, normal rate, no retractions or distress.   Cardio: Appears well-perfused, noncyanotic Musculoskeletal: no obvious deformity Skin: visible skin without rashes, ecchymosis, erythema Neuro: Awake and oriented X 3,  Psych:  normal affect, Insight and Judgment appropriate.    Assessment/Plan: 1. Shift work sleep disorder -continue with hydroxyzine 25-50 mg as needed for  sleep  -return as needed; reassess PHQSADS at next follow up    I discussed the assessment and treatment plan with the patient and/or parent/guardian.  They were provided an opportunity to ask questions and all were answered.   They agreed with the plan and demonstrated an understanding of the instructions. They were advised to call back or seek an in-person evaluation in the emergency room if the symptoms worsen or if the condition fails to improve as anticipated.   Follow-up:  PRN or 3 months    Georges Mouse, NP    CC: Nadene Rubins Nyoka Cowden, MD, Melida Quitter, MD

## 2022-12-27 ENCOUNTER — Other Ambulatory Visit: Payer: Self-pay | Admitting: Family

## 2023-01-17 ENCOUNTER — Encounter: Payer: Self-pay | Admitting: Family

## 2023-01-19 ENCOUNTER — Ambulatory Visit (INDEPENDENT_AMBULATORY_CARE_PROVIDER_SITE_OTHER): Payer: 59 | Admitting: Family

## 2023-01-19 ENCOUNTER — Other Ambulatory Visit (HOSPITAL_COMMUNITY)
Admission: RE | Admit: 2023-01-19 | Discharge: 2023-01-19 | Disposition: A | Discharge status: Home / Self Care | Payer: 59 | Source: Ambulatory Visit | Attending: Family | Admitting: Family

## 2023-01-19 ENCOUNTER — Encounter: Payer: Self-pay | Admitting: Family

## 2023-01-19 VITALS — BP 133/91 | HR 86 | Ht 64.67 in | Wt 254.4 lb

## 2023-01-19 DIAGNOSIS — Z113 Encounter for screening for infections with a predominantly sexual mode of transmission: Secondary | ICD-10-CM

## 2023-01-19 DIAGNOSIS — Z3202 Encounter for pregnancy test, result negative: Secondary | ICD-10-CM

## 2023-01-19 DIAGNOSIS — N898 Other specified noninflammatory disorders of vagina: Secondary | ICD-10-CM

## 2023-01-19 DIAGNOSIS — R300 Dysuria: Secondary | ICD-10-CM

## 2023-01-19 LAB — POCT URINALYSIS DIPSTICK
Bilirubin, UA: NEGATIVE
Blood, UA: NEGATIVE
Glucose, UA: NEGATIVE
Ketones, UA: NEGATIVE
Nitrite, UA: NEGATIVE
Protein, UA: NEGATIVE
Spec Grav, UA: 1.02 (ref 1.010–1.025)
Urobilinogen, UA: 1 E.U./dL
pH, UA: 5.5 (ref 5.0–8.0)

## 2023-01-19 LAB — POCT URINE PREGNANCY: Preg Test, Ur: NEGATIVE

## 2023-01-19 MED ORDER — FLUCONAZOLE 150 MG PO TABS: ORAL_TABLET | ORAL | 0 refills | Status: AC

## 2023-01-19 NOTE — Progress Notes (Signed)
History was provided by the patient.  Alicia Hood is a 22 y.o. female who is here for vaginal itching, irritation.   PCP confirmed? Yes.    Melida Quitter, MD  HPI:    -thinks she has yeast infection; started Thursday or Friday  -uncomfortable, then burning with urination, then itching  -vaginal discharge, not much  -red irritated, white cottage cheese on Saturday  -bought Monistat, used 1-2 applicators; doesn't burn with urination anymore and not as itchy -LMP February, doing continuous cycling  -no recent antibiotics, no missed OCPs   Patient Active Problem List   Diagnosis Date Noted   GAD (generalized anxiety disorder) 04/01/2021   PCOS (polycystic ovarian syndrome) 08/05/2017    Current Outpatient Medications on File Prior to Visit  Medication Sig Dispense Refill   hydrOXYzine (ATARAX) 25 MG tablet Take 1-2 tablets (25-50 mg total) by mouth at bedtime as needed. 90 tablet 0   Norethindrone Acetate-Ethinyl Estradiol (LOESTRIN) 1.5-30 MG-MCG tablet Take 1 tablet by mouth daily. 84 tablet 3   Vitamin D, Ergocalciferol, (DRISDOL) 1.25 MG (50000 UNIT) CAPS capsule TAKE 1 CAPSULE BY MOUTH EVERY 7 DAYS 8 capsule 0   No current facility-administered medications on file prior to visit.    No Known Allergies  Physical Exam:    Vitals:   01/19/23 0946 01/19/23 0947  BP: (!) 149/96 (!) 133/91  Pulse: 89 86  Weight: 254 lb 6.4 oz (115.4 kg)   Height: 5' 4.67" (1.643 m)     Blood pressure %iles are not available for patients who are 18 years or older. No LMP recorded.  Physical Exam Constitutional:      General: She is not in acute distress.    Appearance: She is well-developed.  HENT:     Head: Normocephalic and atraumatic.  Eyes:     General: No scleral icterus.    Pupils: Pupils are equal, round, and reactive to light.  Neck:     Thyroid: No thyromegaly.  Cardiovascular:     Rate and Rhythm: Normal rate and regular rhythm.     Heart sounds: Normal heart sounds.  No murmur heard. Pulmonary:     Effort: Pulmonary effort is normal.     Breath sounds: Normal breath sounds.  Abdominal:     Palpations: Abdomen is soft.  Musculoskeletal:        General: Normal range of motion.     Cervical back: Normal range of motion and neck supple.  Lymphadenopathy:     Cervical: No cervical adenopathy.  Skin:    General: Skin is warm and dry.     Findings: No rash.  Neurological:     Mental Status: She is alert and oriented to person, place, and time.     Cranial Nerves: No cranial nerve deficit.  Psychiatric:        Behavior: Behavior normal.        Thought Content: Thought content normal.        Judgment: Judgment normal.     Assessment/Plan:  -clinical course consistent with vaginal yeast infection; will treat empirically with Diflucan as she has benefited from Monistat. Will rule out UTI, other infections. Return precautions reviewed; return as needed.   1. Vaginal discharge - WET PREP BY MOLECULAR PROBE  2. Dysuria - POCT urinalysis dipstick  3. Negative pregnancy test - POCT urine pregnancy  4. Routine screening for STI (sexually transmitted infection) - Urine cytology ancillary only

## 2023-01-20 LAB — URINE CYTOLOGY ANCILLARY ONLY
Bacterial Vaginitis-Urine: NEGATIVE
Candida Urine: NEGATIVE
Chlamydia: NEGATIVE
Comment: NEGATIVE
Comment: NEGATIVE
Comment: NORMAL
Neisseria Gonorrhea: NEGATIVE
Trichomonas: NEGATIVE

## 2023-01-21 ENCOUNTER — Encounter: Payer: Self-pay | Admitting: Family

## 2023-01-21 LAB — WET PREP BY MOLECULAR PROBE
Candida species: NOT DETECTED
Gardnerella vaginalis: NOT DETECTED
MICRO NUMBER:: 14978343
SPECIMEN QUALITY:: ADEQUATE
Trichomonas vaginosis: NOT DETECTED

## 2023-02-18 ENCOUNTER — Encounter: Payer: Self-pay | Admitting: Family

## 2023-02-18 ENCOUNTER — Other Ambulatory Visit: Payer: Self-pay | Admitting: Family

## 2023-02-18 MED ORDER — NAPROXEN 500 MG PO TABS: 500.0000 mg | ORAL_TABLET | Freq: Two times a day (BID) | ORAL | 1 refills | Status: DC

## 2023-02-20 ENCOUNTER — Other Ambulatory Visit: Payer: Self-pay | Admitting: Family

## 2023-02-20 ENCOUNTER — Encounter: Payer: Self-pay | Admitting: Family

## 2023-02-20 DIAGNOSIS — E282 Polycystic ovarian syndrome: Secondary | ICD-10-CM

## 2023-02-20 MED ORDER — NORETHINDRONE ACET-ETHINYL EST 1.5-30 MG-MCG PO TABS
1.0000 | ORAL_TABLET | Freq: Every day | ORAL | 3 refills | Status: DC
Start: 2023-02-20 — End: 2024-02-24

## 2023-03-16 ENCOUNTER — Other Ambulatory Visit: Payer: Self-pay | Admitting: Family

## 2023-03-16 ENCOUNTER — Ambulatory Visit: Payer: 59 | Admitting: Family

## 2023-03-16 VITALS — BP 137/87 | HR 82 | Ht 64.25 in | Wt 261.6 lb

## 2023-03-16 DIAGNOSIS — Z3202 Encounter for pregnancy test, result negative: Secondary | ICD-10-CM

## 2023-03-16 DIAGNOSIS — N946 Dysmenorrhea, unspecified: Secondary | ICD-10-CM | POA: Diagnosis not present

## 2023-03-16 DIAGNOSIS — R109 Unspecified abdominal pain: Secondary | ICD-10-CM

## 2023-03-16 DIAGNOSIS — M546 Pain in thoracic spine: Secondary | ICD-10-CM | POA: Diagnosis not present

## 2023-03-16 DIAGNOSIS — R82998 Other abnormal findings in urine: Secondary | ICD-10-CM

## 2023-03-16 DIAGNOSIS — R300 Dysuria: Secondary | ICD-10-CM | POA: Diagnosis not present

## 2023-03-16 LAB — POCT URINALYSIS DIPSTICK
Bilirubin, UA: NEGATIVE
Glucose, UA: NEGATIVE
Ketones, UA: NEGATIVE
Protein, UA: POSITIVE — AB
Spec Grav, UA: 1.025 (ref 1.010–1.025)
Urobilinogen, UA: NEGATIVE E.U./dL — AB
pH, UA: 5 (ref 5.0–8.0)

## 2023-03-16 NOTE — Progress Notes (Unsigned)
History was provided by the {[email or web address]}.  Alicia Hood is a 22 y.o. female who is here for ***.   PCP confirmed? {yes [id number]}  Melida Quitter, MD  Plan from last visit 01/19/23  -clinical course consistent with vaginal yeast infection; will treat empirically with Diflucan as she has benefited from Monistat. Will rule out UTI, other infections. Return precautions reviewed; return as needed.    1. Vaginal discharge - WET PREP BY MOLECULAR PROBE   2. Dysuria - POCT urinalysis dipstick   3. Negative pregnancy test - POCT urine pregnancy   4. Routine screening for STI (sexually transmitted infection) - Urine cytology ancillary only  HPI:    -no issues with vaginal  -3 days bad back and stomach pains; feels like normal cramping now but Friday was awful -Friday night was really bad, worse on Saturday morning when she got off work, was almost going to ER for pain; used 2 heating pads + Naproxen - relieved for about 3 hours  -cycle: supposed to be starting soon, took last pill on Saturday; usually takes a few days before bleeding comes on; no bleeding yet  -re: bowel habits: no poop yesterday, did Friday night at work + Saturday morning threw up due to pain; ate at 3AM then took Naproxen at 730 AM when she got home, threw up about an hour later. didn't feel any relief with vomiting; describes Bristol 4 stool pattern; no mucous or blood in stool or emesis; no bile; no fever  -no vaginal discharge, no missed pills, no pain with intercourse, no dysuria  -  Patient Active Problem List   Diagnosis Date Noted   GAD (generalized anxiety disorder) 04/01/2021   PCOS (polycystic ovarian syndrome) 08/05/2017    Current Outpatient Medications on File Prior to Visit  Medication Sig Dispense Refill   hydrOXYzine (ATARAX) 25 MG tablet Take 1-2 tablets (25-50 mg total) by mouth at bedtime as needed. 90 tablet 0   naproxen (NAPROSYN) 500 MG tablet Take 1 tablet (500 mg total) by mouth 2  (two) times daily with a meal. 30 tablet 1   Norethindrone Acetate-Ethinyl Estradiol (LOESTRIN) 1.5-30 MG-MCG tablet Take 1 tablet by mouth daily. 84 tablet 3   Vitamin D, Ergocalciferol, (DRISDOL) 1.25 MG (50000 UNIT) CAPS capsule TAKE 1 CAPSULE BY MOUTH EVERY 7 DAYS 8 capsule 0   fluconazole (DIFLUCAN) 150 MG tablet Take 1 tablet today and 1 tablet 3 days from now (Patient not taking: Reported on 03/16/2023) 2 tablet 0   No current facility-administered medications on file prior to visit.    No Known Allergies  Physical Exam:    Vitals:   03/16/23 0832 03/16/23 0833  BP: (!) 147/91 137/87  Pulse: 85 82  Weight:  261 lb 9.6 oz (118.7 kg)  Height:  5' 4.25" (1.632 m)    Growth %ile SmartLinks can only be used for patients less than 23 years old. No LMP recorded.  Physical Exam   Assessment/Plan: ***

## 2023-03-17 ENCOUNTER — Encounter: Payer: Self-pay | Admitting: Family

## 2023-03-17 LAB — URINE CULTURE
MICRO NUMBER:: 15200029
SPECIMEN QUALITY:: ADEQUATE

## 2023-03-17 LAB — POCT URINE PREGNANCY: Preg Test, Ur: NEGATIVE

## 2023-03-23 ENCOUNTER — Other Ambulatory Visit: Payer: Self-pay | Admitting: Family

## 2023-04-13 ENCOUNTER — Other Ambulatory Visit: Payer: Self-pay | Admitting: Family

## 2023-05-28 ENCOUNTER — Encounter: Payer: Self-pay | Admitting: Family

## 2023-05-29 ENCOUNTER — Other Ambulatory Visit: Payer: Self-pay | Admitting: Family

## 2023-05-29 DIAGNOSIS — N898 Other specified noninflammatory disorders of vagina: Secondary | ICD-10-CM

## 2023-10-08 ENCOUNTER — Ambulatory Visit
Admission: RE | Admit: 2023-10-08 | Discharge: 2023-10-08 | Disposition: A | Discharge status: Home / Self Care | Payer: 59 | Source: Ambulatory Visit | Attending: Internal Medicine | Admitting: Internal Medicine

## 2023-10-08 VITALS — BP 110/79 | HR 100 | Temp 99.3°F | Resp 18

## 2023-10-08 DIAGNOSIS — J111 Influenza due to unidentified influenza virus with other respiratory manifestations: Secondary | ICD-10-CM | POA: Diagnosis not present

## 2023-10-08 DIAGNOSIS — J209 Acute bronchitis, unspecified: Secondary | ICD-10-CM

## 2023-10-08 LAB — POCT INFLUENZA A/B
Influenza A, POC: NEGATIVE
Influenza B, POC: NEGATIVE

## 2023-10-08 MED ORDER — PREDNISONE 20 MG PO TABS: 40.0000 mg | ORAL_TABLET | Freq: Every day | ORAL | 0 refills | Status: AC

## 2023-10-08 MED ORDER — ALBUTEROL SULFATE HFA 108 (90 BASE) MCG/ACT IN AERS
2.0000 | INHALATION_SPRAY | Freq: Once | RESPIRATORY_TRACT | Status: AC
  Administered 2023-10-08: 2 via RESPIRATORY_TRACT

## 2023-10-08 MED ORDER — AEROCHAMBER PLUS FLO-VU MEDIUM MISC
1.0000 | Freq: Once | Status: AC
  Administered 2023-10-08: 1

## 2023-10-08 MED ORDER — OSELTAMIVIR PHOSPHATE 75 MG PO CAPS: 75.0000 mg | ORAL_CAPSULE | Freq: Two times a day (BID) | ORAL | 0 refills | Status: AC

## 2023-10-08 MED ORDER — PROMETHAZINE-DM 6.25-15 MG/5ML PO SYRP: 5.0000 mL | ORAL_SOLUTION | Freq: Every evening | ORAL | 0 refills | Status: AC | PRN

## 2023-10-08 MED ORDER — ALBUTEROL SULFATE (2.5 MG/3ML) 0.083% IN NEBU
2.5000 mg | INHALATION_SOLUTION | Freq: Once | RESPIRATORY_TRACT | Status: AC
  Administered 2023-10-08: 2.5 mg via RESPIRATORY_TRACT

## 2023-10-08 NOTE — ED Provider Notes (Signed)
 [facility]    CSN: [id number] Arrival date & time: 10/08/23  1611      History   Chief Complaint Chief Complaint  Patient presents with   Generalized Body Aches    Cough, low grade fever, body aches, chills/sweats - Entered by patient    HPI Alicia Hood is a 23 y.o. female.   Patient presents to urgent care for evaluation of cough, nasal congestion, fever, chills, body aches, and intermittent shortness of breath associated with cough that started yesterday abruptly.  Cough is minimally productive and with associated bilateral rib cage discomfort and shortness of breath.  Max temp at home 100.9 last night.  This responded well to Tylenol .  Reports intermittent nausea without any vomiting.  No diarrhea, heart palpitations, dizziness, headache, ear pain, rash.  Denies history of chronic respiratory problems.  Never smoker.  Taking ibuprofen  over-the-counter without much relief.  She did recently travel to [place]  through an airport without a mask on and may have been exposed to sick contacts.     Past Medical History:  Diagnosis Date   PCOS (polycystic ovarian syndrome)     Patient Active Problem List   Diagnosis Date Noted   GAD (generalized anxiety disorder) 04/01/2021   PCOS (polycystic ovarian syndrome) 08/05/2017    Past Surgical History:  Procedure Laterality Date   ADENOIDECTOMY  2010   TYMPANOSTOMY TUBE PLACEMENT  2010    OB History   No obstetric history on file.      Home Medications    Prior to Admission medications   Medication Sig Start Date End Date Taking? Authorizing Provider  oseltamivir  (TAMIFLU ) 75 MG capsule Take 1 capsule (75 mg total) by mouth every 12 (twelve) hours. 10/08/23  Yes Enedelia Dorna HERO, FNP  predniSONE  (DELTASONE ) 20 MG tablet Take 2 tablets (40 mg total) by mouth daily with breakfast for 5 days. 10/08/23 10/13/23 Yes StanhopeDorna HERO, FNP  promethazine -dextromethorphan (PROMETHAZINE -DM) 6.25-15 MG/5ML syrup Take  5 mLs by mouth at bedtime as needed for cough. 10/08/23  Yes Enedelia Dorna HERO, FNP  fluconazole  (DIFLUCAN ) 150 MG tablet Take 1 tablet today and 1 tablet 3 days from now Patient not taking: Reported on 03/16/2023 01/19/23   Joshua Bari HERO, NP  hydrOXYzine  (ATARAX ) 25 MG tablet Take 1-2 tablets (25-50 mg total) by mouth at bedtime as needed. 12/06/22   Joshua Bari HERO, NP  naproxen  (NAPROSYN ) 500 MG tablet TAKE 1 TABLET(500 MG) BY MOUTH TWICE DAILY WITH A MEAL 03/23/23   Joshua Bari HERO, NP  Norethindrone  Acetate-Ethinyl Estradiol (LOESTRIN) 1.5-30 MG-MCG tablet Take 1 tablet by mouth daily. 02/20/23   Joshua Bari HERO, NP  Vitamin D , Ergocalciferol , (DRISDOL ) 1.25 MG (50000 UNIT) CAPS capsule TAKE 1 CAPSULE BY MOUTH EVERY 7 DAYS 04/13/23   Joshua Bari HERO, NP    Family History Family History  Problem Relation Age of Onset   Gestational diabetes Mother     Social History Social History   Tobacco Use   Smoking status: Never    Passive exposure: Yes   Smokeless tobacco: Never  Vaping Use   Vaping status: Never Used  Substance Use Topics   Alcohol use: Never   Drug use: Never     Allergies   Patient has no known allergies.   Review of Systems Review of Systems Per HPI  Physical Exam Triage Vital Signs ED Triage Vitals  Encounter Vitals Group     BP 10/08/23 1709 110/79     Systolic BP Percentile --  Diastolic BP Percentile --      Pulse Rate 10/08/23 1709 100     Resp 10/08/23 1709 18     Temp 10/08/23 1709 99.3 F (37.4 C)     Temp Source 10/08/23 1709 Oral     SpO2 10/08/23 1709 97 %     Weight --      Height --      Head Circumference --      Peak Flow --      Pain Score 10/08/23 1711 7     Pain Loc --      Pain Education --      Exclude from Growth Chart --    No data found.  Updated Vital Signs BP 110/79 (BP Location: Right Arm)   Pulse 100   Temp 99.3 F (37.4 C) (Oral)   Resp 18   LMP 09/13/2023 (Approximate)   SpO2 97%   Visual  Acuity Right Eye Distance:   Left Eye Distance:   Bilateral Distance:    Right Eye Near:   Left Eye Near:    Bilateral Near:     Physical Exam Vitals and nursing note reviewed.  Constitutional:      Appearance: She is not ill-appearing or toxic-appearing.  HENT:     Head: Normocephalic and atraumatic.     Right Ear: Hearing, tympanic membrane, ear canal and external ear normal.     Left Ear: Hearing, tympanic membrane, ear canal and external ear normal.     Nose: Nose normal.     Mouth/Throat:     Lips: Pink.     Mouth: Mucous membranes are moist. No injury or oral lesions.     Dentition: Normal dentition.     Tongue: No lesions.     Pharynx: Oropharynx is clear. Uvula midline. No pharyngeal swelling, oropharyngeal exudate, posterior oropharyngeal erythema, uvula swelling or postnasal drip.     Tonsils: No tonsillar exudate.  Eyes:     General: Lids are normal. Vision grossly intact. Gaze aligned appropriately.     Extraocular Movements: Extraocular movements intact.     Conjunctiva/sclera: Conjunctivae normal.  Neck:     Trachea: Trachea and phonation normal.  Cardiovascular:     Rate and Rhythm: Normal rate and regular rhythm.     Heart sounds: Normal heart sounds, S1 normal and S2 normal.  Pulmonary:     Effort: Pulmonary effort is normal. No respiratory distress.     Breath sounds: Normal air entry. No stridor. Wheezing (Expiratory wheezes heard to bilateral lower lung fields.) present. No rhonchi or rales.     Comments: Speaking in full sentences without difficulty or increased respiratory effort.  Harsh cough elicited with deep inspiration with audible wheeze. Chest:     Chest wall: No tenderness.  Musculoskeletal:     Cervical back: Neck supple.  Lymphadenopathy:     Cervical: No cervical adenopathy.  Skin:    General: Skin is warm and dry.     Capillary Refill: Capillary refill takes less than 2 seconds.     Findings: No rash.  Neurological:     General: No  focal deficit present.     Mental Status: She is alert and oriented to person, place, and time. Mental status is at baseline.     Cranial Nerves: No dysarthria or facial asymmetry.  Psychiatric:        Mood and Affect: Mood normal.        Speech: Speech normal.  Behavior: Behavior normal.        Thought Content: Thought content normal.        Judgment: Judgment normal.      UC Treatments / Results  Labs (all labs ordered are listed, but only abnormal results are displayed) Labs Reviewed  POCT INFLUENZA A/B    EKG   Radiology No results found.  Procedures Procedures (including critical care time)  Medications Ordered in UC Medications  albuterol  (VENTOLIN  HFA) 108 (90 Base) MCG/ACT inhaler 2 puff (has no administration in time range)  AeroChamber Plus Flo-Vu Medium MISC 1 each (has no administration in time range)  albuterol  (PROVENTIL ) (2.5 MG/3ML) 0.083% nebulizer solution 2.5 mg (2.5 mg Nebulization Given 10/08/23 1812)    Initial Impression / Assessment and Plan / UC Course  I have reviewed the triage vital signs and the nursing notes.  Pertinent labs & imaging results that were available during my care of the patient were reviewed by me and considered in my medical decision making (see chart for details).   1.  Acute bronchitis, influenza-like illness High clinical suspicion for influenza given high fever and quick onset of symptoms.  Will go ahead and treat with Tamiflu  given clinical suspicion for influenza. Wheezing on exam.  Albuterol  breathing treatment given in clinic with significant improvement in lung sounds on reassessment. May take prednisone  once daily for the next 5 days starting tomorrow. Otherwise recommend supportive care for symptomatic relief as outlined in AVS.  Patient nontoxic appearing with hemodynamically stable vital signs, therefore deferred imaging of the chest.  Modes of transmission, quarantine recommendations, and hand hygiene  discussed.   Counseled patient on potential for adverse effects with medications prescribed/recommended today, strict ER and return-to-clinic precautions discussed, patient verbalized understanding.    Final Clinical Impressions(s) / UC Diagnoses   Final diagnoses:  Acute bronchitis, unspecified organism  Influenza-like illness     Discharge Instructions      You have bronchitis which is inflammation of the upper airways in your lungs due to a virus. The following medicines will help with your symptoms.   - Take steroid pills sent to pharmacy as directed. Do not take any other NSAID containing medications such as ibuprofen  or naproxen /Aleve  while taking prednisone . - You may use albuterol  inhaler 1 to 2 puffs every 4-6 hours as needed for cough, shortness of breath, and wheezing. - Take cough medicines as needed. - Use mucinex to break up congestion in nose/chest (guaifenesin 600mg  every 12 hours as needed).   If you develop any new or worsening symptoms or do not improve in the next 2 to 3 days, please return.  If your symptoms are severe, please go to the emergency room. Follow-up with PCP as needed.     ED Prescriptions     Medication Sig Dispense Auth. Provider   predniSONE  (DELTASONE ) 20 MG tablet Take 2 tablets (40 mg total) by mouth daily with breakfast for 5 days. 10 tablet Enedelia Dorna HERO, FNP   promethazine -dextromethorphan (PROMETHAZINE -DM) 6.25-15 MG/5ML syrup Take 5 mLs by mouth at bedtime as needed for cough. 118 mL Enedelia Dorna M, FNP   oseltamivir  (TAMIFLU ) 75 MG capsule Take 1 capsule (75 mg total) by mouth every 12 (twelve) hours. 10 capsule Enedelia Dorna HERO, FNP      PDMP not reviewed this encounter.   Enedelia Dorna HERO, [place] 10/08/23 1827

## 2023-10-08 NOTE — ED Triage Notes (Signed)
 Pt c/o body aches, cough, sore throat, headache, and fatigue since yesterday.   She just got back from traveling to [place] . Took ibuprofen  about 4 hours ago

## 2023-10-08 NOTE — Discharge Instructions (Addendum)
 You have bronchitis which is inflammation of the upper airways in your lungs due to a virus. The following medicines will help with your symptoms.   - Take steroid pills sent to pharmacy as directed. Do not take any other NSAID containing medications such as ibuprofen or naproxen/Aleve while taking prednisone. - You may use albuterol inhaler 1 to 2 puffs every 4-6 hours as needed for cough, shortness of breath, and wheezing. - Take cough medicines as needed. - Use mucinex to break up congestion in nose/chest (guaifenesin 600mg  every 12 hours as needed).   If you develop any new or worsening symptoms or do not improve in the next 2 to 3 days, please return.  If your symptoms are severe, please go to the emergency room. Follow-up with PCP as needed.

## 2023-11-04 DIAGNOSIS — Z0000 Encounter for general adult medical examination without abnormal findings: Secondary | ICD-10-CM | POA: Diagnosis not present

## 2023-11-04 DIAGNOSIS — E559 Vitamin D deficiency, unspecified: Secondary | ICD-10-CM | POA: Diagnosis not present

## 2023-11-14 IMAGING — CR DG KNEE COMPLETE 4+V*R*
4 series · 4 of 4 positions shown · non-contrast
Comparison: None Available.

CLINICAL DATA: Knee pain.

EXAM:
RIGHT KNEE - COMPLETE 4+ VIEW

[knee lat]
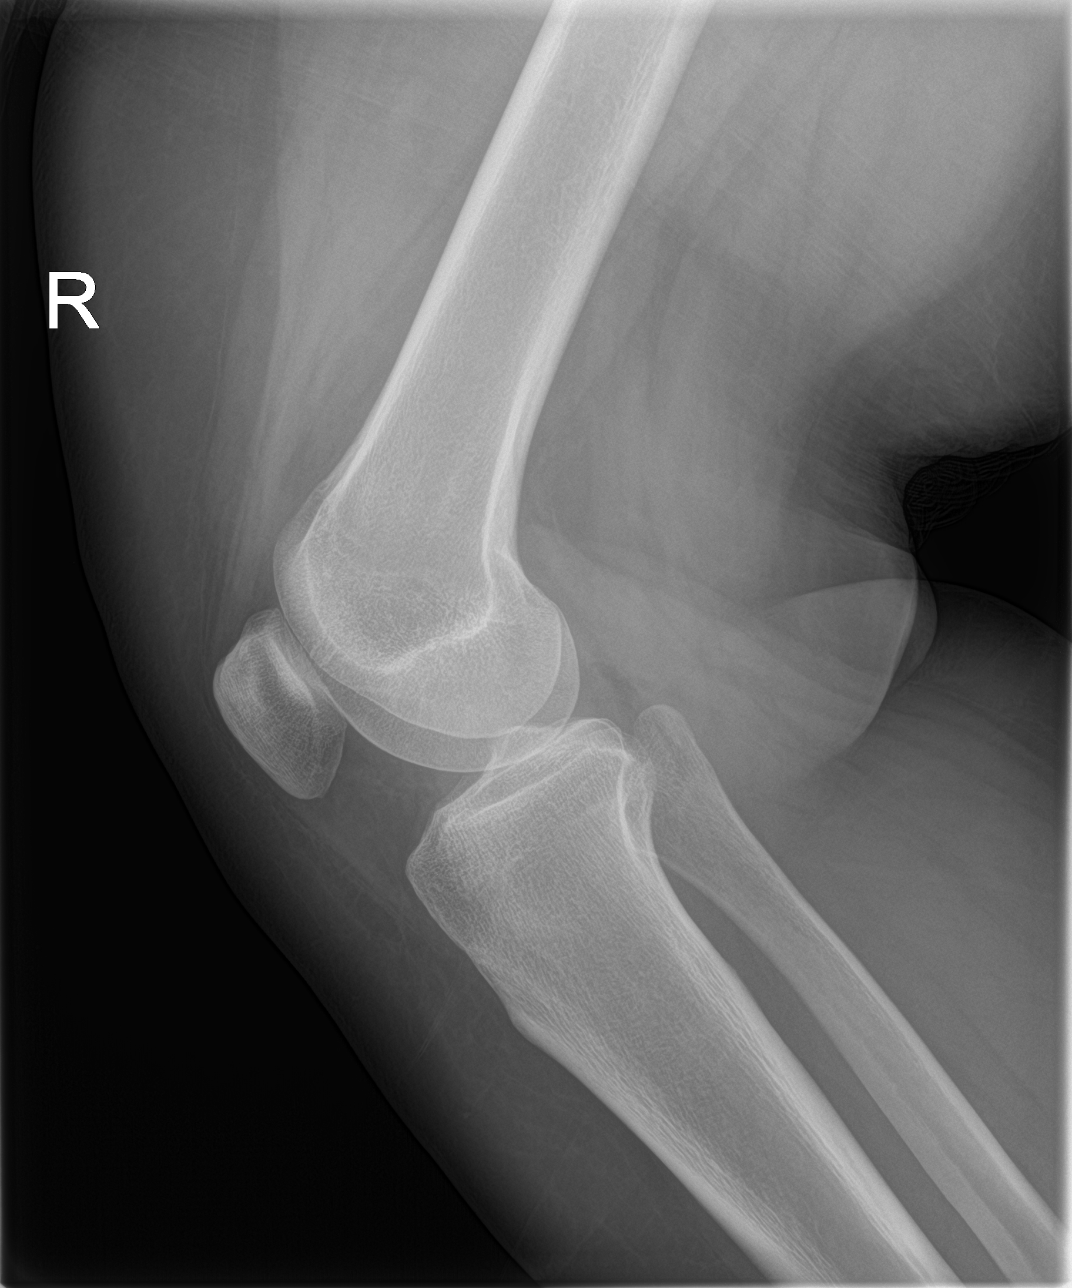

[knee obl (1 of 2)]
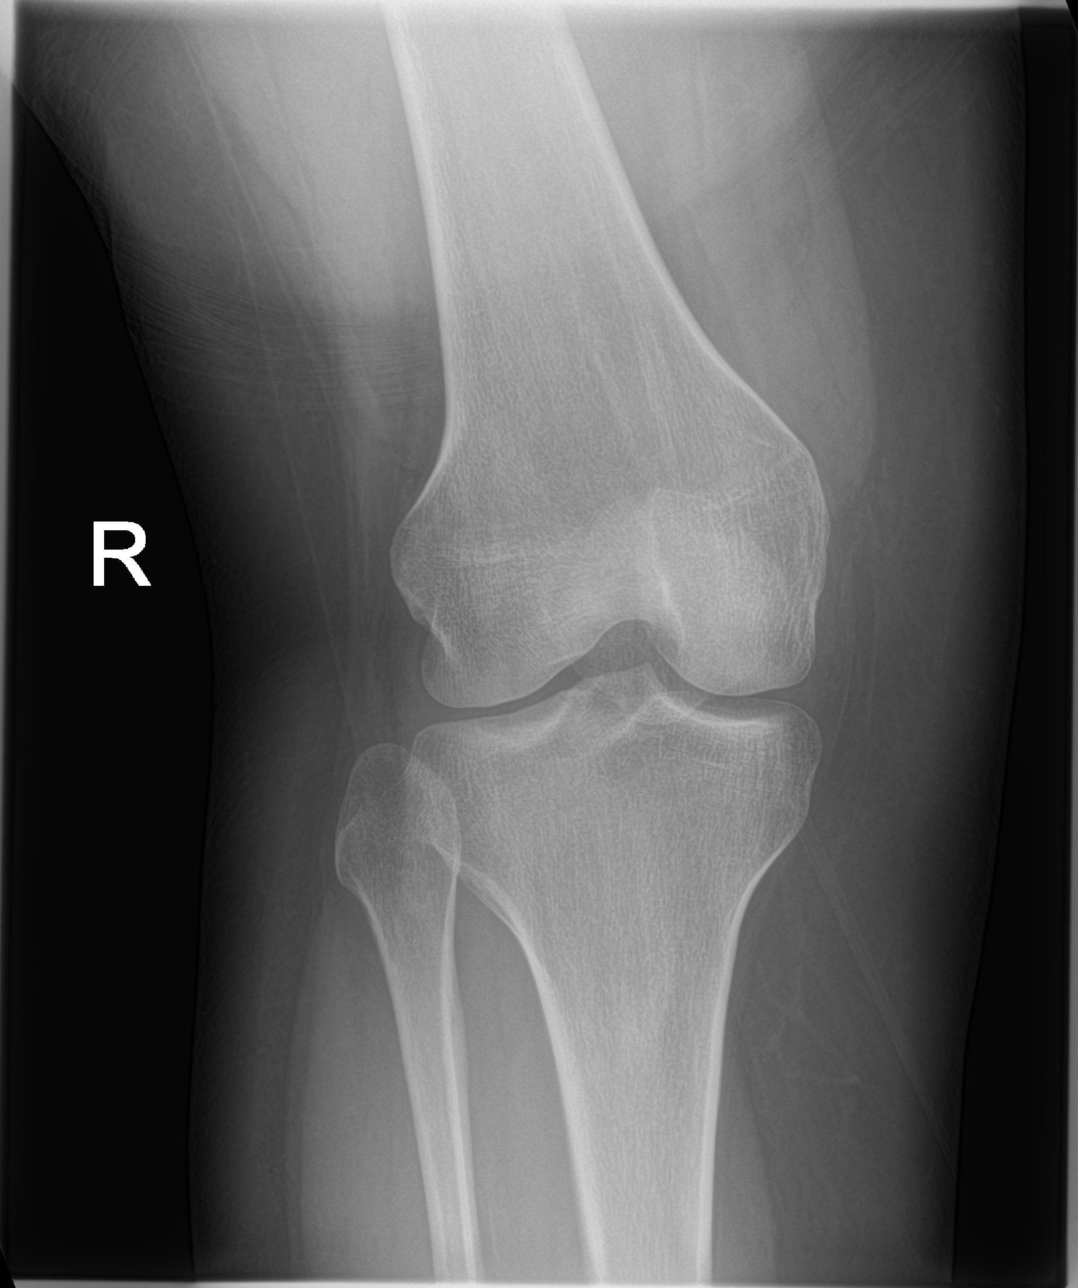

[knee obl (2 of 2)]
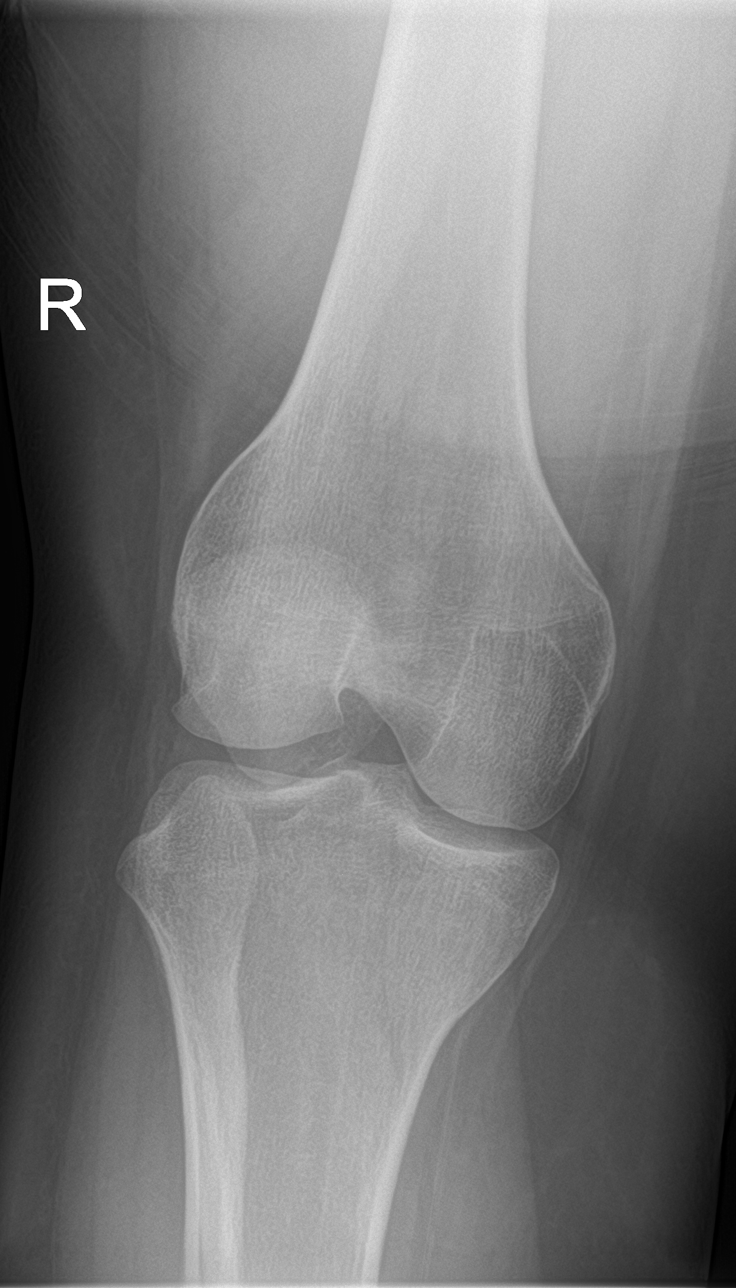

[knee ap]
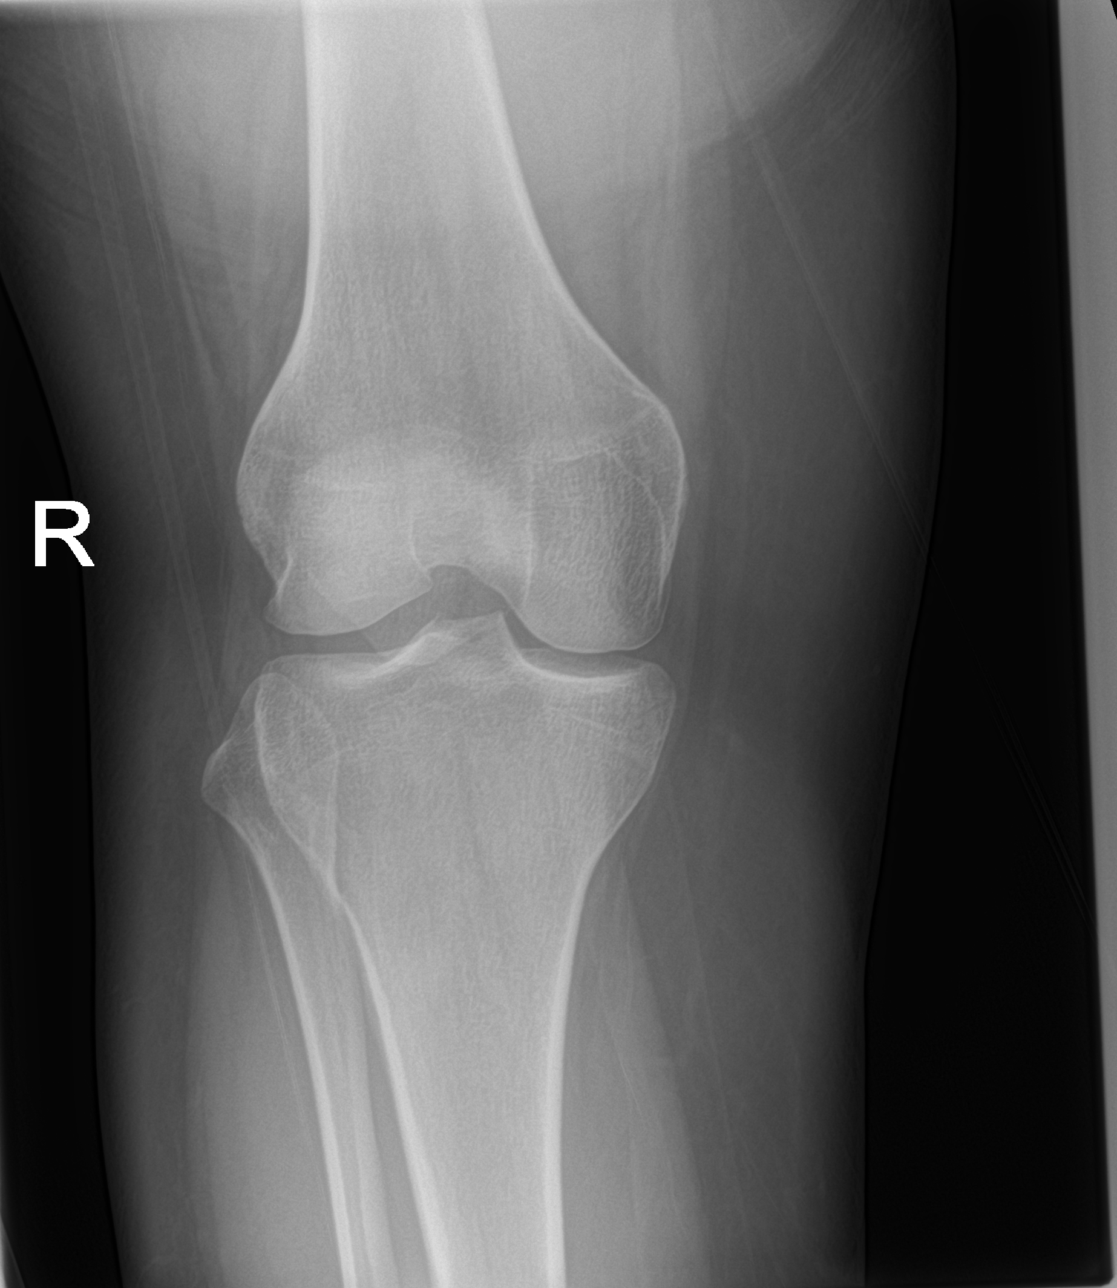

[4 of 4 positions shown; findings below may reference images not displayed]

FINDINGS: No evidence of fracture, dislocation, or joint effusion. No evidence
of arthropathy or other focal bone abnormality. Soft tissues are
unremarkable.
IMPRESSION: Negative.

## 2023-11-25 DIAGNOSIS — Z113 Encounter for screening for infections with a predominantly sexual mode of transmission: Secondary | ICD-10-CM | POA: Diagnosis not present

## 2023-11-25 DIAGNOSIS — E282 Polycystic ovarian syndrome: Secondary | ICD-10-CM | POA: Diagnosis not present

## 2023-11-25 DIAGNOSIS — Z130 Encounter for screening for diseases of the blood and blood-forming organs and certain disorders involving the immune mechanism: Secondary | ICD-10-CM | POA: Diagnosis not present

## 2023-11-25 DIAGNOSIS — Z01419 Encounter for gynecological examination (general) (routine) without abnormal findings: Secondary | ICD-10-CM | POA: Diagnosis not present

## 2023-11-25 DIAGNOSIS — Z1389 Encounter for screening for other disorder: Secondary | ICD-10-CM | POA: Diagnosis not present

## 2024-02-23 ENCOUNTER — Other Ambulatory Visit: Payer: Self-pay | Admitting: Family

## 2024-02-23 DIAGNOSIS — E282 Polycystic ovarian syndrome: Secondary | ICD-10-CM

## 2024-03-18 DIAGNOSIS — Z6841 Body Mass Index (BMI) 40.0 and over, adult: Secondary | ICD-10-CM | POA: Diagnosis not present

## 2024-03-18 DIAGNOSIS — M545 Low back pain, unspecified: Secondary | ICD-10-CM | POA: Diagnosis not present

## 2024-03-18 DIAGNOSIS — F419 Anxiety disorder, unspecified: Secondary | ICD-10-CM | POA: Diagnosis not present
